# Patient Record
Sex: Male | Born: 2005
Health system: Southern US, Community
[De-identification: ages and names within clinical notes are randomized; demographics above are authoritative.]

## PROBLEM LIST (undated history)

## (undated) DIAGNOSIS — J45909 Unspecified asthma, uncomplicated: Secondary | ICD-10-CM

## (undated) DIAGNOSIS — T7840XA Allergy, unspecified, initial encounter: Secondary | ICD-10-CM

## (undated) HISTORY — DX: Unspecified asthma, uncomplicated: J45.909

## (undated) HISTORY — DX: Allergy, unspecified, initial encounter: T78.40XA

---
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
## 2024-01-19 ENCOUNTER — Other Ambulatory Visit (HOSPITAL_COMMUNITY): Payer: Self-pay

## 2024-01-20 ENCOUNTER — Other Ambulatory Visit (HOSPITAL_COMMUNITY): Payer: Self-pay

## 2024-01-23 ENCOUNTER — Ambulatory Visit
Admission: EM | Admit: 2024-01-23 | Discharge: 2024-01-23 | Disposition: A | Discharge status: Home / Self Care | Attending: Family Medicine | Admitting: Family Medicine

## 2024-01-23 ENCOUNTER — Other Ambulatory Visit: Payer: Self-pay

## 2024-01-23 ENCOUNTER — Encounter: Payer: Self-pay | Admitting: Emergency Medicine

## 2024-01-23 DIAGNOSIS — R112 Nausea with vomiting, unspecified: Secondary | ICD-10-CM

## 2024-01-23 DIAGNOSIS — R197 Diarrhea, unspecified: Secondary | ICD-10-CM

## 2024-01-23 MED ORDER — ONDANSETRON 4 MG PO TBDP: 4.0000 mg | ORAL_TABLET | Freq: Three times a day (TID) | ORAL | 0 refills | Status: AC | PRN

## 2024-01-23 MED ORDER — ONDANSETRON 4 MG PO TBDP
4.0000 mg | ORAL_TABLET | Freq: Once | ORAL | Status: AC
  Administered 2024-01-23: 4 mg via ORAL

## 2024-01-23 NOTE — ED Provider Notes (Signed)
 [facility]    CSN: [id number] Arrival date & time: 01/23/24  1118      History   Chief Complaint Chief Complaint  Patient presents with   Diarrhea    HPI Jim Jordan is a 18 y.o. male.   Patient presenting today with several day history of nausea vomiting and diarrhea.  Has also fever and chills intermittently.  Denies cough, congestion, sore throat, chest pain, shortness of breath, severe abdominal pain.  No new foods or medications, no known sick contacts recently.  No known history of chronic GI issues.    Past Medical History:  Diagnosis Date   Allergy    Asthma     Patient Active Problem List   Diagnosis Date Noted   Pharyngitis 07/08/2021   Rhinitis, allergic 10/15/2015    History reviewed. No pertinent surgical history.     Home Medications    Prior to Admission medications   Medication Sig Start Date End Date Taking? Authorizing Provider  ondansetron  (ZOFRAN -ODT) 4 MG disintegrating tablet Take 1 tablet (4 mg total) by mouth every 8 (eight) hours as needed for nausea or vomiting. 01/23/24  Yes Corbin Dess, PA-C  albuterol  (VENTOLIN  HFA) 108 (90 Base) MCG/ACT inhaler Inhale 2 puffs into the lungs every 6 (six) hours as needed for wheezing or shortness of breath. 03/08/20   Corita Diego, PA-C  diclofenac  Sodium (VOLTAREN ) 1 % GEL Apply 2 g topically 4 (four) times daily. 05/08/22   Tommas Fragmin A, FNP  EPINEPHrine  (EPIPEN  2-PAK) 0.3 mg/0.3 mL IJ SOAJ injection Use as directed as needed 03/25/22     EPINEPHrine  0.3 mg/0.3 mL IJ SOAJ injection Inject into the muscle as directed. 11/29/19   [provider]  fluticasone  (FLONASE ) 50 MCG/ACT nasal spray Place 2 sprays into both nostrils daily. 10/13/21   Gaylyn Keas Mary-Margaret, FNP  ibuprofen  (ADVIL ) 600 MG tablet Take 1 tablet (600 mg total) by mouth every 8 (eight) hours as needed. 05/08/22   Tommas Fragmin A, FNP  levocetirizine (XYZAL ) 5 MG tablet Take 1 tablet (5 mg total) by  mouth every evening. As needed for allergies/ runny nose Patient not taking: Reported on 01/23/2024 11/17/22   Eliodoro Guerin, DO    Family History Family History  Problem Relation Age of Onset   Healthy Mother    Hyperlipidemia Father    Asthma Sister    Healthy Brother     Social History Social History   Tobacco Use   Smoking status: Never   Smokeless tobacco: Never  Vaping Use   Vaping status: Never Used  Substance Use Topics   Alcohol use: No   Drug use: No     Allergies   Clindamycin /lincomycin, Amoxicillin, Cefdinir, Omni-pac, and Zithromax [azithromycin]   Review of Systems Review of Systems Per HPI  Physical Exam Triage Vital Signs ED Triage Vitals  Encounter Vitals Group     BP 01/23/24 1135 (!) 106/23     Systolic BP Percentile --      Diastolic BP Percentile --      Pulse Rate 01/23/24 1135 82     Resp 01/23/24 1135 20     Temp 01/23/24 1135 98.5 F (36.9 C)     Temp Source 01/23/24 1135 Oral     SpO2 01/23/24 1135 97 %     Weight --      Height --      Head Circumference --      Peak Flow --  Pain Score 01/23/24 1136 2     Pain Loc --      Pain Education --      Exclude from Growth Chart --    No data found.  Updated Vital Signs BP (!) 106/23 (BP Location: Right Arm)   Pulse 82   Temp 98.5 F (36.9 C) (Oral)   Resp 20   SpO2 97%   Visual Acuity Right Eye Distance:   Left Eye Distance:   Bilateral Distance:    Right Eye Near:   Left Eye Near:    Bilateral Near:     Physical Exam Vitals and nursing note reviewed.  Constitutional:      Appearance: Normal appearance.  HENT:     Head: Atraumatic.  Eyes:     Extraocular Movements: Extraocular movements intact.     Conjunctiva/sclera: Conjunctivae normal.  Cardiovascular:     Rate and Rhythm: Normal rate and regular rhythm.  Pulmonary:     Effort: Pulmonary effort is normal.     Breath sounds: Normal breath sounds.  Abdominal:     General: Bowel sounds are normal.  There is no distension.     Palpations: Abdomen is soft.     Tenderness: There is no abdominal tenderness. There is no guarding.  Musculoskeletal:        General: Normal range of motion.     Cervical back: Normal range of motion and neck supple.  Skin:    General: Skin is warm and dry.  Neurological:     General: No focal deficit present.     Mental Status: He is oriented to person, place, and time.  Psychiatric:        Mood and Affect: Mood normal.        Thought Content: Thought content normal.        Judgment: Judgment normal.      UC Treatments / Results  Labs (all labs ordered are listed, but only abnormal results are displayed) Labs Reviewed - No data to display  EKG   Radiology No results found.  Procedures Procedures (including critical care time)  Medications Ordered in UC Medications  ondansetron  (ZOFRAN -ODT) disintegrating tablet 4 mg (4 mg Oral Given 01/23/24 1237)    Initial Impression / Assessment and Plan / UC Course  I have reviewed the triage vital signs and the nursing notes.  Pertinent labs & imaging results that were available during my care of the patient were reviewed by me and considered in my medical decision making (see chart for details).     Vitals and exam reassuring today without red flag findings.  Zofran  given for active nausea, will treat with Zofran , brat diet, fluids, rest.  Suspect viral GI illness.  Work note given.  Return for worsening symptoms.  Final Clinical Impressions(s) / UC Diagnoses   Final diagnoses:  Nausea vomiting and diarrhea   Discharge Instructions   None    ED Prescriptions     Medication Sig Dispense Auth. Provider   ondansetron  (ZOFRAN -ODT) 4 MG disintegrating tablet Take 1 tablet (4 mg total) by mouth every 8 (eight) hours as needed for nausea or vomiting. 20 tablet Corbin Dess, [place]      PDMP not reviewed this encounter.   Corbin Dess, [place] 01/23/24 1248

## 2024-01-23 NOTE — ED Triage Notes (Signed)
 Pt reports abd pain, diarrhea, intermittent fever since Thursday. Has been taking tylenol and otc medication with minimal change in symptoms.

## 2024-01-24 ENCOUNTER — Ambulatory Visit: Payer: Self-pay
